# Patient Record
Sex: Male | Born: 1970 | Race: White | Hispanic: No | Marital: Single | Smoking: Current every day smoker
Health system: Southern US, Community
[De-identification: ages and names within clinical notes are randomized; demographics above are authoritative.]

## PROBLEM LIST (undated history)

## (undated) DIAGNOSIS — E119 Type 2 diabetes mellitus without complications: Secondary | ICD-10-CM

## (undated) DIAGNOSIS — K219 Gastro-esophageal reflux disease without esophagitis: Secondary | ICD-10-CM

## (undated) DIAGNOSIS — I10 Essential (primary) hypertension: Secondary | ICD-10-CM

## (undated) DIAGNOSIS — E785 Hyperlipidemia, unspecified: Secondary | ICD-10-CM

## (undated) HISTORY — PX: CHOLECYSTECTOMY: SHX55

## (undated) HISTORY — PX: COSMETIC SURGERY: SHX468

## (undated) HISTORY — DX: Gastro-esophageal reflux disease without esophagitis: K21.9

## (undated) HISTORY — DX: Type 2 diabetes mellitus without complications: E11.9

## (undated) HISTORY — DX: Hyperlipidemia, unspecified: E78.5

## (undated) HISTORY — PX: HAND SURGERY: SHX662

## (undated) HISTORY — DX: Essential (primary) hypertension: I10

---

## 2006-06-08 HISTORY — PX: CHOLECYSTECTOMY: SHX55

## 2006-08-06 ENCOUNTER — Encounter: Admission: RE | Admit: 2006-08-06 | Discharge: 2006-08-06 | Payer: Self-pay | Admitting: Family Medicine

## 2006-08-13 ENCOUNTER — Ambulatory Visit: Payer: Self-pay | Admitting: Gastroenterology

## 2006-08-13 LAB — CONVERTED CEMR LAB
ALT: 59 units/L — ABNORMAL HIGH (ref 0–40)
BUN: 11 mg/dL (ref 6–23)
Basophils Relative: 0 % (ref 0.0–1.0)
CO2: 30 meq/L (ref 19–32)
Calcium: 9.8 mg/dL (ref 8.4–10.5)
Creatinine, Ser: 0.9 mg/dL (ref 0.4–1.5)
Eosinophils Absolute: 0.2 10*3/uL (ref 0.0–0.6)
GFR calc Af Amer: 122 mL/min
Glucose, Bld: 77 mg/dL (ref 70–99)
Lymphocytes Relative: 36.1 % (ref 12.0–46.0)
MCHC: 34.3 g/dL (ref 30.0–36.0)
MCV: 88.3 fL (ref 78.0–100.0)
Monocytes Absolute: 1.2 10*3/uL — ABNORMAL HIGH (ref 0.2–0.7)
Neutro Abs: 5.1 10*3/uL (ref 1.4–7.7)
Platelets: 466 10*3/uL — ABNORMAL HIGH (ref 150–400)
Potassium: 4.2 meq/L (ref 3.5–5.1)
RDW: 11.2 % — ABNORMAL LOW (ref 11.5–14.6)
Sodium: 141 meq/L (ref 135–145)
Total Bilirubin: 0.8 mg/dL (ref 0.3–1.2)

## 2006-08-18 ENCOUNTER — Ambulatory Visit: Payer: Self-pay | Admitting: Cardiology

## 2006-09-14 ENCOUNTER — Inpatient Hospital Stay (HOSPITAL_COMMUNITY): Admission: EM | Admit: 2006-09-14 | Discharge: 2006-09-17 | Payer: Self-pay | Admitting: Emergency Medicine

## 2006-09-16 ENCOUNTER — Ambulatory Visit: Payer: Self-pay | Admitting: Gastroenterology

## 2006-09-27 ENCOUNTER — Ambulatory Visit (HOSPITAL_COMMUNITY): Admission: RE | Admit: 2006-09-27 | Discharge: 2006-09-27 | Payer: Self-pay | Admitting: General Surgery

## 2008-04-24 IMAGING — NM NM LIVER FUNCTION STUDY
2 series · 12 of 12 positions shown · non-contrast
Comparison: none

CLINICAL DATA: Persistent abdominal pain.  Negative prior abdominal ultrasound on 08/06/06, normal standard hepatobiliary scan on 09/14/06.  Further hepatobiliary study is now performed in estimating gallbladder ejection fraction. 
 NUCLEAR MEDICINE HEPATOBILIARY SCAN ? 09/16/06:
TECHNIQUE: Sequential abdominal images were obtained for approximately 60 minutes following intravenous injection of radiopharmaceutical.
 Radiopharmaceutical:  5.1 mCi Dc-11m Choletec IV.  8 ounces of half-and-half were also administered in calculation of ejection fraction.

[hepato · 2.40mm/px · 6 of 60 frames shown (1 of 2)]
[frame 6/60]
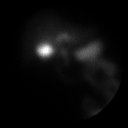
[frame 16/60]
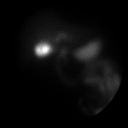
[frame 26/60]
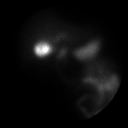
[frame 36/60]
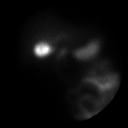
[frame 46/60]
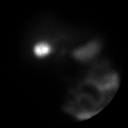
[frame 56/60]
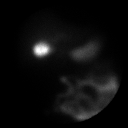

[hepato · 2.40mm/px · 6 of 60 frames shown (2 of 2)]
[frame 6/60]
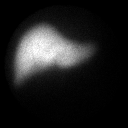
[frame 16/60]
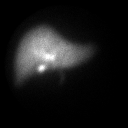
[frame 26/60]
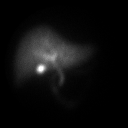
[frame 36/60]
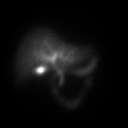
[frame 46/60]
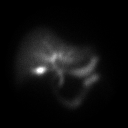
[frame 56/60]
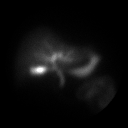

[12 of 12 positions shown; findings below may reference images not displayed]

FINDINGS: Anterior projection imaging again demonstrates normal hepatic excretion of radiopharmaceutical with prompt visualization of the gallbladder and biliary tree by 15 minutes.  Normal patency of the cystic duct and common duct again demonstrated with visualization of small bowel beginning at 25 minutes.
 After administration of half-and-half, there is very little decrease in activity within the gallbladder with estimated ejection fraction of 10% at 60 minutes.  This is abnormal.
IMPRESSION: Evidence of biliary dyskinesia by hepatobiliary scan with abnormally low gallbladder ejection fraction of 10% after administration of half-and-half.

## 2015-09-10 ENCOUNTER — Encounter: Payer: BLUE CROSS/BLUE SHIELD | Attending: Family Medicine | Admitting: Dietician

## 2015-09-10 ENCOUNTER — Encounter: Payer: Self-pay | Admitting: Dietician

## 2015-09-10 DIAGNOSIS — E119 Type 2 diabetes mellitus without complications: Secondary | ICD-10-CM | POA: Diagnosis present

## 2015-09-10 DIAGNOSIS — E7800 Pure hypercholesterolemia, unspecified: Secondary | ICD-10-CM

## 2015-09-10 NOTE — Patient Instructions (Signed)
Plan:  Aim for 3 Carb Choices per meal (45 grams) +/- 1 either way  Aim for 0-1 Carbs per snack if hungry  Include protein in moderation with your meals and snacks Consider reading food labels for Total Carbohydrate and Fat Grams of foods Consider  increasing your activity level by walking for 30 minutes daily as tolerated  Eat slowly!  Stop when you are full.  Eat away from TV and other electronics. Increase your fiber intake slowly.  Men need 35 grams per day. Plant sterols and stanols. Calorie [employer] or other site to become educated about what food you are choosing out to eat. Consider checking thyroid.   Get some source of Vitamin D.  (Sunlight March-October, Almond Milk) Add protein to your smoothie (Plain [place] Yogurt or protein powder)

## 2015-09-10 NOTE — Progress Notes (Signed)
Diabetes Self-Management Education  Visit Type: First/Initial  Appt. Start Time: 0805 Appt. End Time: 0930  09/10/2015  [name], identified by name and date of birth, is a 45 y.o. male with a diagnosis of Diabetes: Type 2.  Other hx includes HTN, hyperlipidemia, lactose intolerance, and GERD.  He states that he is salt sensitive and this causes his blood pressure to increase.  Weight hx:   180-190 lbs in his 20's 210 lbs through mid 63's States that he started on Lipitor for 3 months and gained 30-40 lbs then increased from there over the past 3-4 years.  States that he has prioritized his career over his health.  We discussed other things that may have had a role in weight gain such as increased eating out and portion sizes and increased traveling.  He lives alone.  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    ASSESSMENT  Height 6' (1.829 m), weight 303 lb (137.44 kg). Body mass index is 41.09 kg/(m^2).      Diabetes Self-Management Education - 09/10/15 0815    Visit Information   Visit Type First/Initial   Initial Visit   Diabetes Type Type 2   Are you currently following a meal plan? No   Are you taking your medications as prescribed? Not on Medications   Date Diagnosed 07/2015   Health Coping   How would you rate your overall health? Fair   Psychosocial Assessment   Patient Belief/Attitude about Diabetes Denial   Self-care barriers Other (comment)  increased travel   Self-management support [place] office   Other persons present Patient   Patient Concerns Nutrition/Meal planning;Weight Control   Special Needs None   Preferred Learning Style No preference indicated   Learning Readiness Ready   How often do you need to have someone help you when you read instructions, pamphlets, or other written materials from your doctor or pharmacy? 1 - Never   What is the last grade level you completed in school? 4  years college   Complications   Last HgB A1C per patient/outside source 6.6 %  07/2015   How often do you check your blood sugar? 0 times/day (not testing)   Have you had a dilated eye exam in the past 12 months? No   Have you had a dental exam in the past 12 months? Yes   Are you checking your feet? No   Dietary Intake   Breakfast Smoothie (spinach, broccoli, banana, 1/3 cup blueberries, 1 tsp fiber, 1 cap apple cider vinegar, carrots, flax seeds) when home, hotel:  bagel or eggs and fruit   Snack (morning) none   Lunch [employer] del[employer] (1/2 [place] or chicken salad sandwich, pasta salad or potato salad) OR chick fil-A or [employer] OR Lean Cuisine OR bag of peanuts OR nabs   Snack (afternoon) none   Dinner Chick fil-A:  grilled or salads OR [employer] OR [employer]-  Very large portions but recently has been halfing it.  6-7   Snack (evening) none or SF ice pops   Beverage(s) occasional juice, coffee with 1 sugar   Exercise   Exercise Type Light (walking / raking leaves)   How many days per week to you exercise? 2   How many minutes per day do you exercise? 30   Total minutes per week of exercise 60   Patient Education   Previous Diabetes Education No   Disease state  Definition of diabetes, type 1 and 2, and the diagnosis of diabetes;Explored patient's options for treatment of their diabetes   Nutrition management  Role of diet in the treatment of diabetes and the relationship between the three main macronutrients and blood glucose level;Food label reading, portion sizes and measuring food.;Information on hints to eating out and maintain blood glucose control.;Meal options for control of blood glucose level and chronic complications.   Physical activity and exercise  Role of exercise on diabetes management, blood pressure control and cardiac health.   Monitoring Daily foot exams;Yearly dilated eye exam   Chronic complications Relationship between chronic complications and blood glucose  control   Psychosocial adjustment Worked with patient to identify barriers to care and solutions;Role of stress on diabetes;Travel strategies;Identified and addressed patients feelings and concerns about diabetes   Personal strategies to promote health Lifestyle issues that need to be addressed for better diabetes care;Review risk of smoking and offered smoking cessation   Individualized Goals (developed by patient)   Nutrition General guidelines for healthy choices and portions discussed   Physical Activity Exercise 5-7 days per week;30 minutes per day   Reducing Risk stop smoking   Outcomes   Expected Outcomes Demonstrated interest in learning. Expect positive outcomes   Future DMSE PRN   Program Status Completed      Individualized Plan for Diabetes Self-Management Training:   Learning Objective:  Patient will have a greater understanding of diabetes self-management. Patient education plan is to attend individual and/or group sessions per assessed needs and concerns.   Plan:   Patient Instructions  Plan:  Aim for 3 Carb Choices per meal (45 grams) +/- 1 either way  Aim for 0-1 Carbs per snack if hungry  Include protein in moderation with your meals and snacks Consider reading food labels for Total Carbohydrate and Fat Grams of foods Consider  increasing your activity level by walking for 30 minutes daily as tolerated  Eat slowly!  Stop when you are full.  Eat away from TV and other electronics. Increase your fiber intake slowly.  Men need 35 grams per day. Plant sterols and stanols. Calorie [employer] or other site to become educated about what food you are choosing out to eat. Consider checking thyroid.   Get some source of Vitamin D.  (Sunlight March-October, Almond Milk) Add protein to your smoothie (Plain [place] Yogurt or protein powder)   Expected Outcomes:  Demonstrated interest in learning. Expect positive outcomes  Education material provided: Living Well with  Diabetes, Food label handouts, A1C conversion sheet, Meal plan card, My Plate and Snack sheet, Breakfast ideas  If problems or questions, patient to contact team via:  Phone and Email  Future DSME appointment: PRN

## 2017-08-13 DIAGNOSIS — E119 Type 2 diabetes mellitus without complications: Secondary | ICD-10-CM | POA: Diagnosis not present

## 2017-08-13 DIAGNOSIS — I10 Essential (primary) hypertension: Secondary | ICD-10-CM | POA: Diagnosis not present

## 2017-08-13 DIAGNOSIS — Z0000 Encounter for general adult medical examination without abnormal findings: Secondary | ICD-10-CM | POA: Diagnosis not present

## 2017-08-13 DIAGNOSIS — Z8042 Family history of malignant neoplasm of prostate: Secondary | ICD-10-CM | POA: Diagnosis not present

## 2017-08-13 DIAGNOSIS — K219 Gastro-esophageal reflux disease without esophagitis: Secondary | ICD-10-CM | POA: Diagnosis not present

## 2017-08-13 DIAGNOSIS — E785 Hyperlipidemia, unspecified: Secondary | ICD-10-CM | POA: Diagnosis not present

## 2017-08-13 DIAGNOSIS — R69 Illness, unspecified: Secondary | ICD-10-CM | POA: Diagnosis not present

## 2017-09-13 DIAGNOSIS — R972 Elevated prostate specific antigen [PSA]: Secondary | ICD-10-CM | POA: Diagnosis not present

## 2018-12-14 DIAGNOSIS — R7303 Prediabetes: Secondary | ICD-10-CM | POA: Diagnosis not present

## 2018-12-14 DIAGNOSIS — R69 Illness, unspecified: Secondary | ICD-10-CM | POA: Diagnosis not present

## 2018-12-14 DIAGNOSIS — E785 Hyperlipidemia, unspecified: Secondary | ICD-10-CM | POA: Diagnosis not present

## 2018-12-14 DIAGNOSIS — I10 Essential (primary) hypertension: Secondary | ICD-10-CM | POA: Diagnosis not present

## 2018-12-14 DIAGNOSIS — Z1211 Encounter for screening for malignant neoplasm of colon: Secondary | ICD-10-CM | POA: Diagnosis not present

## 2018-12-14 DIAGNOSIS — K219 Gastro-esophageal reflux disease without esophagitis: Secondary | ICD-10-CM | POA: Diagnosis not present

## 2018-12-14 DIAGNOSIS — E119 Type 2 diabetes mellitus without complications: Secondary | ICD-10-CM | POA: Diagnosis not present

## 2018-12-14 DIAGNOSIS — Z8042 Family history of malignant neoplasm of prostate: Secondary | ICD-10-CM | POA: Diagnosis not present

## 2018-12-20 ENCOUNTER — Encounter: Payer: Self-pay | Admitting: Gastroenterology

## 2018-12-29 DIAGNOSIS — Z8042 Family history of malignant neoplasm of prostate: Secondary | ICD-10-CM | POA: Diagnosis not present

## 2018-12-29 DIAGNOSIS — E119 Type 2 diabetes mellitus without complications: Secondary | ICD-10-CM | POA: Diagnosis not present

## 2018-12-29 DIAGNOSIS — Z1211 Encounter for screening for malignant neoplasm of colon: Secondary | ICD-10-CM | POA: Diagnosis not present

## 2018-12-29 DIAGNOSIS — R69 Illness, unspecified: Secondary | ICD-10-CM | POA: Diagnosis not present

## 2018-12-29 DIAGNOSIS — E785 Hyperlipidemia, unspecified: Secondary | ICD-10-CM | POA: Diagnosis not present

## 2018-12-29 DIAGNOSIS — I10 Essential (primary) hypertension: Secondary | ICD-10-CM | POA: Diagnosis not present

## 2018-12-29 DIAGNOSIS — R7303 Prediabetes: Secondary | ICD-10-CM | POA: Diagnosis not present

## 2018-12-29 DIAGNOSIS — K219 Gastro-esophageal reflux disease without esophagitis: Secondary | ICD-10-CM | POA: Diagnosis not present

## 2019-01-20 ENCOUNTER — Other Ambulatory Visit: Payer: Self-pay

## 2019-01-20 ENCOUNTER — Ambulatory Visit (AMBULATORY_SURGERY_CENTER): Payer: Self-pay | Admitting: *Deleted

## 2019-01-20 VITALS — Temp 97.5°F | Ht 71.0 in | Wt 281.0 lb

## 2019-01-20 DIAGNOSIS — Z1211 Encounter for screening for malignant neoplasm of colon: Secondary | ICD-10-CM

## 2019-01-20 MED ORDER — PEG 3350-KCL-NA BICARB-NACL 420 G PO SOLR: 4000.0000 mL | Freq: Once | ORAL | 0 refills | Status: AC

## 2019-01-20 NOTE — Progress Notes (Signed)
Patient is here for PV today. Patient denies any allergies to eggs or soy. Patient denies any problems with anesthesia/sedation. Patient denies any oxygen use at home. Patient denies taking any diet/weight loss medications or blood thinners. EMMI education assisgned to patient on colonoscopy, this was explained and instructions given to patient.Pt is aware that care partner will wait in the car during procedure; if they feel like they will be too hot to wait in the car; they may wait in the lobby.  We want them to wear a mask (we do not have any that we can provide them), practice social distancing, and we will check their temperatures when they get here.  I did remind patient that their care partner needs to stay in the parking lot the entire time. Pt will wear mask into building.

## 2019-02-03 ENCOUNTER — Encounter: Payer: BLUE CROSS/BLUE SHIELD | Admitting: Gastroenterology

## 2019-02-03 ENCOUNTER — Encounter: Payer: Self-pay | Admitting: Gastroenterology

## 2019-02-03 DIAGNOSIS — R972 Elevated prostate specific antigen [PSA]: Secondary | ICD-10-CM | POA: Diagnosis not present

## 2019-02-09 ENCOUNTER — Telehealth: Payer: Self-pay

## 2019-02-09 NOTE — Telephone Encounter (Signed)
Covid-19 screening questions   Do you now or have you had a fever in the last 14 days? NO   Do you have any respiratory symptoms of shortness of breath or cough now or in the last 14 days? NO  Do you have any family members or close contacts with diagnosed or suspected Covid-19 in the past 14 days? NO  Have you been tested for Covid-19 and found to be positive? NO        

## 2019-02-10 ENCOUNTER — Other Ambulatory Visit: Payer: Self-pay

## 2019-02-10 ENCOUNTER — Encounter: Payer: Self-pay | Admitting: Gastroenterology

## 2019-02-10 ENCOUNTER — Ambulatory Visit (AMBULATORY_SURGERY_CENTER): Payer: 59 | Admitting: Gastroenterology

## 2019-02-10 VITALS — BP 144/84 | HR 69 | Temp 98.4°F | Resp 23 | Ht 71.0 in | Wt 281.0 lb

## 2019-02-10 DIAGNOSIS — K219 Gastro-esophageal reflux disease without esophagitis: Secondary | ICD-10-CM | POA: Diagnosis not present

## 2019-02-10 DIAGNOSIS — Z1211 Encounter for screening for malignant neoplasm of colon: Secondary | ICD-10-CM | POA: Diagnosis not present

## 2019-02-10 DIAGNOSIS — D125 Benign neoplasm of sigmoid colon: Secondary | ICD-10-CM

## 2019-02-10 DIAGNOSIS — K529 Noninfective gastroenteritis and colitis, unspecified: Secondary | ICD-10-CM | POA: Diagnosis not present

## 2019-02-10 DIAGNOSIS — E785 Hyperlipidemia, unspecified: Secondary | ICD-10-CM | POA: Diagnosis not present

## 2019-02-10 DIAGNOSIS — K50018 Crohn's disease of small intestine with other complication: Secondary | ICD-10-CM | POA: Diagnosis not present

## 2019-02-10 DIAGNOSIS — I10 Essential (primary) hypertension: Secondary | ICD-10-CM | POA: Diagnosis not present

## 2019-02-10 MED ORDER — SODIUM CHLORIDE 0.9 % IV SOLN: 500.0000 mL | Freq: Once | INTRAVENOUS | Status: DC

## 2019-02-10 NOTE — Op Note (Signed)
James Valdez Patient Name: James Valdez Procedure Date: 02/10/2019 7:34 AM MRN: [id number] Endoscopist: Milus Banister , MD Age: 48 Referring MD:  Date of Birth: 12/03/1970 Gender: Male Account #: [id number] Procedure:                Colonoscopy Indications:              Screening for colorectal malignant neoplasm Medicines:                Monitored Anesthesia Care Procedure:                Pre-Anesthesia Assessment:                           - Prior to the procedure, a History and Physical                            was performed, and patient medications and                            allergies were reviewed. The patient's tolerance of                            previous anesthesia was also reviewed. The risks                            and benefits of the procedure and the sedation                            options and risks were discussed with the patient.                            All questions were answered, and informed consent                            was obtained. Prior Anticoagulants: The patient has                            taken no previous anticoagulant or antiplatelet                            agents. ASA Grade Assessment: II - A patient with                            mild systemic disease. After reviewing the risks                            and benefits, the patient was deemed in                            satisfactory condition to undergo the procedure.                           After obtaining informed consent, the colonoscope  was passed under direct vision. Throughout the                            procedure, the patient's blood pressure, pulse, and                            oxygen saturations were monitored continuously. The                            Colonoscope was introduced through the anus and                            advanced to the the terminal ileum. The colonoscopy                            was performed  without difficulty. The patient                            tolerated the procedure well. The quality of the                            bowel preparation was good. The terminal ileum,                            ileocecal valve, appendiceal orifice, and rectum                            were photographed. Scope In: 7:52:37 AM Scope Out: 8:07:18 AM Scope Withdrawal Time: 0 hours 12 minutes 54 seconds  Total Procedure Duration: 0 hours 14 minutes 41 seconds  Findings:                 Inflammation, mild in severity and characterized by                            erosions and erythema was found in the terminal                            ileum. Biopsies were taken with a cold forceps for                            histology.                           The mucosa was normal throughout the colon and it                            was sampled in two jars (right and left).                           A 5 mm polyp was found in the sigmoid colon. The                            polyp was semi-pedunculated. The polyp was removed  with a cold snare. Resection and retrieval were                            complete.                           Diverticulosis throughout the colon.                           The exam was otherwise without abnormality on                            direct and retroflexion views. Complications:            No immediate complications. Estimated blood loss:                            None. Estimated Blood Loss:     Estimated blood loss: none. Impression:               - Ileitis, unclear etiology (IBD vs. meds?).                            Biopsied.                           - Normal colon mucosa was sampled (right and left)                           - Diverticulosis.                           - One 5 mm polyp in the sigmoid colon, removed with                            a cold snare. Resected and retrieved.                           - The examination was  otherwise normal on direct                            and retroflexion views. Recommendation:           - Patient has a contact number available for                            emergencies. The signs and symptoms of potential                            delayed complications were discussed with the                            patient. Return to normal activities tomorrow.                            Written discharge instructions were provided to the  patient.                           - Resume previous diet.                           - Continue present medications.                           - Repeat colonoscopy is recommended. The                            colonoscopy date will be determined after pathology                            results from today's exam become available for                            review. Likely 7 years. Milus Banister, MD 02/10/2019 8:14:14 AM This report has been signed electronically.

## 2019-02-10 NOTE — Progress Notes (Signed)
Pt's states no medical or surgical changes since previsit or office visit. 

## 2019-02-10 NOTE — Progress Notes (Signed)
Temperature taken by J.B., VS taken by C.W. 

## 2019-02-10 NOTE — Progress Notes (Signed)
Report to PACU, RN, vss, BBS= Clear.  

## 2019-02-10 NOTE — Progress Notes (Signed)
Called to room to assist during endoscopic procedure.  Patient ID and intended procedure confirmed with present staff. Received instructions for my participation in the procedure from the performing physician.  

## 2019-02-10 NOTE — Patient Instructions (Signed)
Thank you for allowing [place] to care for you today!  Await biopsy results , approximately 2 weeks.  Will make recommendations at that time for future colonoscopy.  Resume previous diet and medications today.  Return to your normal activities tomorrow.      YOU HAD AN ENDOSCOPIC PROCEDURE TODAY AT [facility]:   Refer to the procedure report that was given to you for any specific questions about what was found during the examination.  If the procedure report does not answer your questions, please call your gastroenterologist to clarify.  If you requested that your care partner not be given the details of your procedure findings, then the procedure report has been included in a sealed envelope for you to review at your convenience later.  YOU SHOULD EXPECT: Some feelings of bloating in the abdomen. Passage of more gas than usual.  Walking can help get rid of the air that was put into your GI tract during the procedure and reduce the bloating. If you had a lower endoscopy (such as a colonoscopy or flexible sigmoidoscopy) you may notice spotting of blood in your stool or on the toilet paper. If you underwent a bowel prep for your procedure, you may not have a normal bowel movement for a few days.  Please Note:  You might notice some irritation and congestion in your nose or some drainage.  This is from the oxygen used during your procedure.  There is no need for concern and it should clear up in a day or so.  SYMPTOMS TO REPORT IMMEDIATELY:   Following lower endoscopy (colonoscopy or flexible sigmoidoscopy):  Excessive amounts of blood in the stool  Significant tenderness or worsening of abdominal pains  Swelling of the abdomen that is new, acute  Fever of 100F or higher    For urgent or emergent issues, a gastroenterologist can be reached at any hour by calling [phone].   DIET:  We do recommend a small meal at first, but then you may proceed to your regular diet.   Drink plenty of fluids but you should avoid alcoholic beverages for 24 hours.  ACTIVITY:  You should plan to take it easy for the rest of today and you should NOT DRIVE or use heavy machinery until tomorrow (because of the sedation medicines used during the test).    FOLLOW UP: Our staff will call the number listed on your records 48-72 hours following your procedure to check on you and address any questions or concerns that you may have regarding the information given to you following your procedure. If we do not reach you, we will leave a message.  We will attempt to reach you two times.  During this call, we will ask if you have developed any symptoms of COVID 19. If you develop any symptoms (ie: fever, flu-like symptoms, shortness of breath, cough etc.) before then, please call [phone].  If you test positive for Covid 19 in the 2 weeks post procedure, please call and report this information to [place].    If any biopsies were taken you will be contacted by phone or by letter within the next 1-3 weeks.  Please call us at [phone] if you have not heard about the biopsies in 3 weeks.    SIGNATURES/CONFIDENTIALITY: You and/or your care partner have signed paperwork which will be entered into your electronic medical record.  These signatures attest to the fact that that the information above on your After Visit Summary has  been reviewed and is understood.  Full responsibility of the confidentiality of this discharge information lies with you and/or your care-partner.

## 2019-02-15 ENCOUNTER — Telehealth: Payer: Self-pay | Admitting: *Deleted

## 2019-02-15 NOTE — Telephone Encounter (Signed)
  Follow up Call-  Call back number 02/10/2019  Post procedure Call Back phone  # [phone]  Permission to leave phone message Yes  Some recent data might be hidden     Patient questions:  Do you have a fever, pain , or abdominal swelling? No. Pain Score  0 *  Have you tolerated food without any problems? Yes.    Have you been able to return to your normal activities? Yes.    Do you have any questions about your discharge instructions: Diet   No. Medications  No. Follow up visit  No.  Do you have questions or concerns about your Care? No.  Actions: * If pain score is 4 or above: No action needed, pain <4.  1. Have you developed a fever since your procedure? no  2.   Have you had an respiratory symptoms (SOB or cough) since your procedure? no  3.   Have you tested positive for COVID 19 since your procedure no  4.   Have you had any family members/close contacts diagnosed with the COVID 19 since your procedure?  no   If yes to any of these questions please route to Joylene John, RN and Alphonsa Gin, Therapist, sports.

## 2019-07-22 ENCOUNTER — Ambulatory Visit: Payer: 59 | Attending: Internal Medicine

## 2019-07-22 DIAGNOSIS — Z23 Encounter for immunization: Secondary | ICD-10-CM

## 2019-07-22 NOTE — Progress Notes (Signed)
   Covid-19 Vaccination Clinic  Name:  James Valdez    MRN: [id number] DOB: 09-16-1970  07/22/2019  [name] was observed post Covid-19 immunization for 15 minutes without incidence. He was provided with Vaccine Information Sheet and instruction to access the V-Safe system.   [name] was instructed to call 911 with any severe reactions post vaccine: Marland Kitchen Difficulty breathing  . Swelling of your face and throat  . A fast heartbeat  . A bad rash all over your body  . Dizziness and weakness    Immunizations Administered    Name Date Dose VIS Date Route   Pfizer COVID-19 Vaccine 07/22/2019  8:18 AM 0.3 mL 05/19/2019 Intramuscular   Manufacturer: [place]   Lot: [id number]   [place]: [id number]

## 2019-08-13 ENCOUNTER — Ambulatory Visit: Payer: 59 | Attending: Internal Medicine

## 2019-08-13 DIAGNOSIS — Z23 Encounter for immunization: Secondary | ICD-10-CM | POA: Insufficient documentation

## 2019-08-13 NOTE — Progress Notes (Signed)
   Covid-19 Vaccination Clinic  Name:  James Valdez    MRN: [id number] DOB: Jan 09, 1971  08/13/2019  [name] was observed post Covid-19 immunization for 15 minutes without incident. He was provided with Vaccine Information Sheet and instruction to access the V-Safe system.   [name] was instructed to call 911 with any severe reactions post vaccine: Marland Kitchen Difficulty breathing  . Swelling of face and throat  . A fast heartbeat  . A bad rash all over body  . Dizziness and weakness   Immunizations Administered    Name Date Dose VIS Date Route   Pfizer COVID-19 Vaccine 08/13/2019  9:26 AM 0.3 mL 05/19/2019 Intramuscular   Manufacturer: [place]   Lot: [id number]   [place]: [id number]

## 2021-11-29 ENCOUNTER — Ambulatory Visit (INDEPENDENT_AMBULATORY_CARE_PROVIDER_SITE_OTHER): Payer: No Typology Code available for payment source

## 2021-11-29 ENCOUNTER — Ambulatory Visit
Admission: EM | Admit: 2021-11-29 | Discharge: 2021-11-29 | Disposition: A | Discharge status: Home / Self Care | Payer: No Typology Code available for payment source

## 2021-11-29 DIAGNOSIS — M25572 Pain in left ankle and joints of left foot: Secondary | ICD-10-CM

## 2021-11-29 DIAGNOSIS — M79672 Pain in left foot: Secondary | ICD-10-CM

## 2021-11-29 MED ORDER — IBUPROFEN 600 MG PO TABS: 600.0000 mg | ORAL_TABLET | Freq: Four times a day (QID) | ORAL | 0 refills | Status: DC | PRN

## 2022-02-19 ENCOUNTER — Other Ambulatory Visit: Payer: Self-pay | Admitting: Sports Medicine

## 2022-02-19 DIAGNOSIS — M25572 Pain in left ankle and joints of left foot: Secondary | ICD-10-CM

## 2022-03-01 ENCOUNTER — Ambulatory Visit
Admission: RE | Admit: 2022-03-01 | Discharge: 2022-03-01 | Disposition: A | Discharge status: Home / Self Care | Payer: No Typology Code available for payment source | Source: Ambulatory Visit | Attending: Sports Medicine | Admitting: Sports Medicine

## 2022-03-01 DIAGNOSIS — M25572 Pain in left ankle and joints of left foot: Secondary | ICD-10-CM

## 2023-07-27 ENCOUNTER — Encounter: Payer: Self-pay | Admitting: Physician Assistant

## 2023-09-07 NOTE — Progress Notes (Unsigned)
 09/08/2023 James Valdez [id number] [birth year]  Referring provider: Sigmund Hazel, MD Primary GI doctor: Dr. Doy Hutching (Dr. Christella Hartigan)  ASSESSMENT AND PLAN:   Several episodes of abdominal pain associated with vomiting and diarrhea since Dec 2024,continuing LLQ pain, last episode was Monday, no fever, chills, weight loss, ileitis of unknown significance 2020 on colon without follow up, CT 2008 with wall thickening TI  02/10/2019 colonoscopy for screening ileitis unclear etiology, normal colon mucosa, diverticulosis, 5 mm sigmoid polyp Recall colonoscopy 02/2026 09/15/2006 EGD unremarkable Patient was post to have follow-up visit 2020 after colonoscopy with ileitis showing chronic active inflammation thought secondary to IBD versus medications. Remote CT 2008 showed mild wall thickening terminal ileum thought secondary due to contraction No recent imaging or labs With history some concern for diverticulitis, obstruction versus stricture with TI thickening/ileitis, IBD, gastritis, possible SIBO/gut flora disruption - Order CT abdomen pelvis to evaluate for diverticulitis or bowel obstruction or ileitis  - Check inflammatory markers (sedimentation rate and CRP), very concerned for IBD with past history and images will also get hepatitis panel and TB gold - Provide Ibogard (peppermint oil) samples for bowel spasms. - Prescribe ondansetron for nausea and vomiting. -- Schedule endoscopy and colonoscopy to evaluate for inflammatory bowel disease or other causes for his symptoms at [facility]. Risk of bowel prep, conscious sedation, and EGD and colonoscopy were discussed.  Risks include but are not limited to dehydration, pain, bleeding, cardiopulmonary process, bowel perforation, or other possible adverse outcomes..  Treatment plan was discussed with patient, and agreed upon.  GERD with nausea and vomiting S/p cholecystectomy EGD 2008 negative celiac, unremarkable Lifestyle changes discussed, avoid NSAIDS,  ETOH, hand out given to the patient Weight loss discussed with the patient Smoking cessation discussed in detail Continue prilosec daily  With nausea and vomiting will schedule EGD with colon  Personal history of tubular adenomatous polyps Recall 02/2026  Hepatic steatosis seen on CT abdomen 2008 History of hypertension, type 2 diabetes No recent imaging or labs Will get CBC, HFP, and CT AB and pelvis, hepatitis panel Weight loss advised, stop ETOH Consider further work up pending labs/imaging  Morbid obesity  Body mass index is 40.38 kg/m.  -Patient has been advised to make an attempt to improve diet and exercise patterns to aid in weight loss. -Recommended diet heavy in fruits and veggies and low in animal meats, cheeses, and dairy products, appropriate calorie intake  I have reviewed the clinic note as outlined by Quentin Mulling, PA and agree with the assessment, plan and medical decision making. Mr. Marchetti presents to the office for evaluation of episodic vomiting, diarrhea and bloating progressing over the last 1-1/2 years.  Noteworthy that prior colonoscopy in 2020 disclosed ileitis.  He has a remote CT scan from 2008 with some terminal ileal thickening although underdistention was also considered.  I agree that his disease is in the differential diagnosis.  Appropriate to perform laboratory studies and obtain cross-sectional imaging.  Pending that result further decisions regarding endoscopic evaluation can be considered.  Has a history of colon polyps with a 7-year recall.  Noted to have hepatic steatosis in the past and appropriate to update liver enzymes as above.  [name], MD    Patient Care Team: Sigmund Hazel, MD as PCP - General (Family Medicine)  HISTORY OF PRESENT ILLNESS: 54 y.o. male with a past medical history of hypertension, GERD, diabetes and others listed below presents for evaluation of abdominal pain, vomiting and diarrhea.  Previously known to  Dr.  Christella Hartigan.  Discussed the use of AI scribe software for clinical note transcription with the patient, who gave verbal consent to proceed.  History of Present Illness   James Valdez is a 53 year old male with ileitis and diverticulosis who presents with recurrent episodes of vomiting and diarrhea. He was referred by Dr. Hyacinth Meeker for evaluation of his symptoms.  Since around Christmas of the previous year, he has been experiencing recurrent episodes of vomiting and diarrhea. Initially thought to be the flu, these episodes have persisted intermittently. They are characterized by bloating, sour burps, gas, waves of nausea, vomiting, and explosive watery diarrhea, each lasting a couple of hours, after which he feels perfectly fine. The most recent episode occurred on Monday. These episodes do not occur daily or even weekly.  He has attempted dietary changes, such as eating bland foods and avoiding hard-to-digest items, without relief. He uses Kaopectate and Pepto-Bismol to manage symptoms, which sometimes help alleviate bloating and stomach discomfort. He experiences a persistent discomfort described as a 'sore muscle' in the abdominal area, which is not severe enough to limit activities. Bowel movements between episodes are normal, with no loose stools, blood, or dark stools. No fevers, chills, or weight loss.  His past medical history includes a colonoscopy in 2020 that showed inflammation in the ileum, diverticulosis, and a sigmoid polyp. He also had abdominal surgery as a baby for a condition related to the esophagus and stomach, and his gallbladder has been removed.  He has a family history of colon cancer on his maternal grandfather's side. He smokes and consumes alcohol daily, typically one to two glasses of wine. He has a history of heartburn since his teenage years, for which he occasionally takes omeprazole. He denies any abdominal surgeries other than those mentioned, and he has not experienced any  upper abdominal discomfort, rashes, or joint pain. He was recently prescribed Rybelsus but has not started it yet due to insurance preauthorization.      He  reports that he has been smoking cigarettes. He has never used smokeless tobacco. He reports current alcohol use of about 1.0 standard drink of alcohol per week. He reports that he does not currently use drugs.  RELEVANT GI HISTORY, IMAGING AND LABS: Results   RADIOLOGY CT abdomen pelvis: Thickening of the terminal ileum, fatty liver (2008)  DIAGNOSTIC Colonoscopy: Inflammation in the ileum, ileitis, diverticulosis, 5 mm sigmoid polyp (02/2019)  PATHOLOGY Pathology: Chronic active inflammation in the ileum (02/2019)     02/10/2019 colonoscopy pathology 1. Surgical [P], small bowel, terminal ileum - MILDLY ACTIVE CHRONIC NON-SPECIFIC ILEITIS - NO GRANULOMATA, DYSPLASIA OR MALIGNANCY IDENTIFIED - SEE COMMENT 2. Surgical [P], colon, ascending - BENIGN COLONIC MUCOSA - NO ACTIVE INFLAMMATION OR EVIDENCE OF MICROSCOPIC COLITIS - NO HIGH GRADE DYSPLASIA OR MALIGNANCY IDENTIFIED 3. Surgical [P], colon, descending - BENIGN COLONIC MUCOSA - NO ACTIVE INFLAMMATION OR EVIDENCE OF MICROSCOPIC COLITIS - NO HIGH GRADE DYSPLASIA OR MALIGNANCY IDENTIFIED 4. Surgical [P], colon, sigmoid polyp - TUBULAR ADENOMA (2 OF 3 FRAGMENTS) - BENIGN COLONIC MUCOSA (1 OF 3 FRAGMENTS) - NO HIGH GRADE DYSPLASIA OR MALIGNANCY IDENTIFIED CBC    Component Value Date/Time   WBC 10.2 08/13/2006 1412   RBC 5.27 08/13/2006 1412   HGB 15.9 08/13/2006 1412   HCT 46.5 08/13/2006 1412   PLT 466 (H) 08/13/2006 1412   MCV 88.3 08/13/2006 1412   MCHC 34.3 08/13/2006 1412   RDW 11.2 (L) 08/13/2006 1412   MONOABS 1.2 (H) 08/13/2006  1412   EOSABS 0.2 08/13/2006 1412   BASOSABS 0.0 08/13/2006 1412   No results for input(s): "HGB" in the last 8760 hours.  CMP     Component Value Date/Time   NA 141 08/13/2006 1412   K 4.2 08/13/2006 1412   CL 105 08/13/2006  1412   CO2 30 08/13/2006 1412   GLUCOSE 77 08/13/2006 1412   BUN 11 08/13/2006 1412   CREATININE 0.9 08/13/2006 1412   CALCIUM 9.8 08/13/2006 1412   PROT 7.2 08/13/2006 1412   ALBUMIN 3.9 08/13/2006 1412   AST 36 08/13/2006 1412   ALT 59 (H) 08/13/2006 1412   ALKPHOS 78 08/13/2006 1412   BILITOT 0.8 08/13/2006 1412   GFRNONAA 101 08/13/2006 1412   GFRAA 122 08/13/2006 1412      Latest Ref Rng & Units 08/13/2006    2:12 PM  Hepatic Function  Total Protein 6.0 - 8.3 g/dL 7.2   Albumin 3.5 - 5.2 g/dL 3.9   AST 0 - 37 units/L 36   ALT 0 - 40 units/L 59   Alk Phosphatase 39 - 117 units/L 78   Total Bilirubin 0.3 - 1.2 mg/dL 0.8   Bilirubin, Direct 0.0 - 0.3 mg/dL 0.2       Current Medications:   Current Outpatient Medications (Endocrine & Metabolic):    Semaglutide (RYBELSUS) 3 MG TABS, Take 3 mg by mouth daily.  Current Outpatient Medications (Cardiovascular):    amLODipine (NORVASC) 5 MG tablet, Take 5 mg by mouth daily.   rosuvastatin (CRESTOR) 40 MG tablet, Take 40 mg by mouth daily.     Current Outpatient Medications (Other):    Multiple Vitamin (MULTIVITAMIN WITH MINERALS) TABS tablet, Take 1 tablet by mouth daily.   omeprazole (PRILOSEC OTC) 20 MG tablet, Take 20 mg by mouth as needed.   ondansetron (ZOFRAN) 8 MG tablet, Take 1 tablet (8 mg total) by mouth every 8 (eight) hours as needed for nausea or vomiting.  Medical History:  Past Medical History:  Diagnosis Date   Diabetes mellitus without complication (HCC)    pt denies   GERD (gastroesophageal reflux disease)    Hyperlipidemia    Hypertension    Allergies:  Allergies  Allergen Reactions   Atorvastatin     Other reaction(s): muscle aches     Surgical History:  He  has a past surgical history that includes Hand surgery; Cholecystectomy (2008); and Cosmetic surgery. Family History:  His family history includes Colon cancer in his maternal grandfather; Prostate cancer in his father.  REVIEW OF  SYSTEMS  : All other systems reviewed and negative except where noted in the History of Present Illness.  PHYSICAL EXAM: Pulse 84   Ht 5\' 11"  (1.803 m)   Wt 289 lb 8 oz (131.3 kg)   SpO2 94%   BMI 40.38 kg/m  Physical Exam   GENERAL APPEARANCE: Obese, in no apparent distress. HEENT: No cervical lymphadenopathy, unremarkable thyroid, sclerae anicteric, conjunctiva pink. RESPIRATORY: Respiratory effort normal, breath sounds clear and equal bilaterally without rales, rhonchi, or wheezing. CARDIO: Regular rate and rhythm with no murmurs, rubs, or gallops, peripheral pulses intact. ABDOMEN: Soft, obese, non-distended, active bowel sounds in all four quadrants, non-tender to palpation, no rebound, no mass appreciated. RECTAL: Declines. MUSCULOSKELETAL: Full range of motion, normal gait, without edema. SKIN: Dry, intact without rashes or lesions. No jaundice. NEURO: Alert, oriented, no focal deficits. PSYCH: Cooperative, normal mood and affect.      Doree Albee, PA-C 9:04 AM

## 2023-09-08 ENCOUNTER — Other Ambulatory Visit

## 2023-09-08 ENCOUNTER — Other Ambulatory Visit (INDEPENDENT_AMBULATORY_CARE_PROVIDER_SITE_OTHER)

## 2023-09-08 ENCOUNTER — Encounter: Payer: Self-pay | Admitting: Physician Assistant

## 2023-09-08 ENCOUNTER — Ambulatory Visit (HOSPITAL_COMMUNITY)

## 2023-09-08 ENCOUNTER — Ambulatory Visit: Payer: No Typology Code available for payment source | Admitting: Physician Assistant

## 2023-09-08 VITALS — BP 150/92 | HR 84 | Ht 71.0 in | Wt 289.5 lb

## 2023-09-08 DIAGNOSIS — Z111 Encounter for screening for respiratory tuberculosis: Secondary | ICD-10-CM

## 2023-09-08 DIAGNOSIS — R112 Nausea with vomiting, unspecified: Secondary | ICD-10-CM | POA: Diagnosis not present

## 2023-09-08 DIAGNOSIS — Z1159 Encounter for screening for other viral diseases: Secondary | ICD-10-CM

## 2023-09-08 DIAGNOSIS — R1032 Left lower quadrant pain: Secondary | ICD-10-CM

## 2023-09-08 DIAGNOSIS — K219 Gastro-esophageal reflux disease without esophagitis: Secondary | ICD-10-CM

## 2023-09-08 DIAGNOSIS — R197 Diarrhea, unspecified: Secondary | ICD-10-CM

## 2023-09-08 DIAGNOSIS — Z6841 Body Mass Index (BMI) 40.0 and over, adult: Secondary | ICD-10-CM

## 2023-09-08 DIAGNOSIS — K760 Fatty (change of) liver, not elsewhere classified: Secondary | ICD-10-CM | POA: Diagnosis not present

## 2023-09-08 DIAGNOSIS — E1169 Type 2 diabetes mellitus with other specified complication: Secondary | ICD-10-CM

## 2023-09-08 DIAGNOSIS — Z9049 Acquired absence of other specified parts of digestive tract: Secondary | ICD-10-CM

## 2023-09-08 DIAGNOSIS — Z860101 Personal history of adenomatous and serrated colon polyps: Secondary | ICD-10-CM

## 2023-09-08 LAB — CBC WITH DIFFERENTIAL/PLATELET
Basophils Absolute: 0.2 10*3/uL — ABNORMAL HIGH (ref 0.0–0.1)
Basophils Relative: 1.8 % (ref 0.0–3.0)
Eosinophils Absolute: 0.2 10*3/uL (ref 0.0–0.7)
Eosinophils Relative: 2 % (ref 0.0–5.0)
HCT: 47.5 % (ref 39.0–52.0)
Hemoglobin: 16.2 g/dL (ref 13.0–17.0)
Lymphocytes Relative: 28.6 % (ref 12.0–46.0)
Lymphs Abs: 2.9 10*3/uL (ref 0.7–4.0)
MCHC: 34.1 g/dL (ref 30.0–36.0)
MCV: 92.1 fl (ref 78.0–100.0)
Monocytes Absolute: 0.9 10*3/uL (ref 0.1–1.0)
Monocytes Relative: 8.6 % (ref 3.0–12.0)
Neutro Abs: 5.9 10*3/uL (ref 1.4–7.7)
Neutrophils Relative %: 59 % (ref 43.0–77.0)
Platelets: 351 10*3/uL (ref 150.0–400.0)
RBC: 5.15 Mil/uL (ref 4.22–5.81)
RDW: 13 % (ref 11.5–15.5)
WBC: 10 10*3/uL (ref 4.0–10.5)

## 2023-09-08 LAB — HEPATIC FUNCTION PANEL
ALT: 26 U/L (ref 0–53)
AST: 22 U/L (ref 0–37)
Albumin: 4.6 g/dL (ref 3.5–5.2)
Alkaline Phosphatase: 77 U/L (ref 39–117)
Bilirubin, Direct: 0.1 mg/dL (ref 0.0–0.3)
Total Bilirubin: 0.5 mg/dL (ref 0.2–1.2)
Total Protein: 7.7 g/dL (ref 6.0–8.3)

## 2023-09-08 LAB — SEDIMENTATION RATE: Sed Rate: 19 mm/h (ref 0–20)

## 2023-09-08 LAB — HIGH SENSITIVITY CRP: CRP, High Sensitivity: 4.27 mg/L (ref 0.000–5.000)

## 2023-09-08 LAB — BASIC METABOLIC PANEL WITH GFR
BUN: 10 mg/dL (ref 6–23)
CO2: 27 meq/L (ref 19–32)
Calcium: 9.6 mg/dL (ref 8.4–10.5)
Chloride: 105 meq/L (ref 96–112)
Creatinine, Ser: 0.86 mg/dL (ref 0.40–1.50)
GFR: 97.84 mL/min (ref >60.00)
Glucose, Bld: 101 mg/dL — ABNORMAL HIGH (ref 70–99)
Potassium: 4.2 meq/L (ref 3.5–5.1)
Sodium: 140 meq/L (ref 135–145)

## 2023-09-08 LAB — LIPASE: Lipase: 38 U/L (ref 11.0–59.0)

## 2023-09-08 LAB — TSH: TSH: 1.76 u[IU]/mL (ref 0.35–5.50)

## 2023-09-08 MED ORDER — SUFLAVE 178.7 G PO SOLR: 1.0000 | Freq: Once | ORAL | 0 refills | Status: AC

## 2023-09-08 MED ORDER — ONDANSETRON HCL 8 MG PO TABS: 8.0000 mg | ORAL_TABLET | Freq: Three times a day (TID) | ORAL | 0 refills | Status: AC | PRN

## 2023-09-08 NOTE — Patient Instructions (Signed)
 You have been scheduled for a CT scan of the abdomen and pelvis at [facility], 1st floor Radiology. You are scheduled on 09/08/23  at 5:00 pm. You should arrive 15 minutes prior to your appointment time for registration.   If you have any questions regarding your exam or if you need to reschedule, you may call [facility] at [phone] between the hours of 8:00 am and 5:00 pm, Monday-Friday.    Your provider has requested that you go to the basement level for lab work before leaving today. Press "B" on the elevator. The lab is located at the first door on the left as you exit the elevator.  First do a trial off milk/lactose products if you use them.  Add fiber like benefiber or citracel once a day Increase activity Can do trial of IBGard which is over the counter for AB pain- Take 1-2 capsules once a day for maintence or twice a day during a flare    FODMAP stands for fermentable oligo-, di-, mono-saccharides and polyols (1). These are the scientific terms used to classify groups of carbs that are difficult for our body to digest and that are notorious for triggering digestive symptoms like bloating, gas, loose stools and stomach pain.   You can try low FODMAP diet  - start with eliminating just one column at a time that you feel may be a trigger for you. - the table at the very bottom contains foods that are low in FODMAPs   Sometimes trying to eliminate the FODMAP's from your diet is difficult or tricky, if you are stuggling with trying to do the elimination diet you can try an enzyme.  There is a food enzymes that you sprinkle in or on your food that helps break down the FODMAP. You can read more about the enzyme by going to this site: https://fodzyme.com/   Metabolic dysfunction associated seatohepatitis  Now the leading cause of liver failure in the [place].  It is normally from such risk factors as obesity, diabetes, insulin resistance, high cholesterol,  or metabolic syndrome.  The only definitive therapy is weight loss and exercise.   Suggest walking 20-30 mins daily.  Decreasing carbohydrates, increasing veggies.    Fatty Liver Fatty liver is the accumulation of fat in liver cells. It is also called hepatosteatosis or steatohepatitis. It is normal for your liver to contain some fat. If fat is more than 5 to 10% of your liver's weight, you have fatty liver.  There are often no symptoms (problems) for years while damage is still occurring. People often learn about their fatty liver when they have medical tests for other reasons. Fat can damage your liver for years or even decades without causing problems. When it becomes severe, it can cause fatigue, weight loss, weakness, and confusion. This makes you more likely to develop more serious liver problems. The liver is the largest organ in the body. It does a lot of work and often gives no warning signs when it is sick until late in a disease. The liver has many important jobs including: Breaking down foods. Storing vitamins, iron, and other minerals. Making proteins. Making bile for food digestion. Breaking down many products including medications, alcohol and some poisons.  PROGNOSIS  Fatty liver may cause no damage or it can lead to an inflammation of the liver. This is, called steatohepatitis.  Over time the liver may become scarred and hardened. This condition is called cirrhosis. Cirrhosis is serious and  may lead to liver failure or cancer. NASH is one of the leading causes of cirrhosis. About 10-20% of Americans have fatty liver and a smaller 2-5% has NASH.  TREATMENT  Weight loss, fat restriction, and exercise in overweight patients produces inconsistent results but is worth trying. Good control of diabetes may reduce fatty liver. Eat a balanced, healthy diet. Increase your physical activity. There are no medical or surgical treatments for a fatty liver or NASH, but improving your diet  and increasing your exercise may help prevent or reverse some of the damage.

## 2023-09-09 ENCOUNTER — Ambulatory Visit (HOSPITAL_COMMUNITY)
Admission: RE | Admit: 2023-09-09 | Discharge: 2023-09-09 | Disposition: A | Discharge status: Home / Self Care | Source: Ambulatory Visit | Attending: Physician Assistant | Admitting: Physician Assistant

## 2023-09-09 DIAGNOSIS — R1032 Left lower quadrant pain: Secondary | ICD-10-CM | POA: Insufficient documentation

## 2023-09-09 MED ORDER — IOHEXOL 9 MG/ML PO SOLN: 500.0000 mL | ORAL | Status: AC

## 2023-09-09 MED ORDER — IOHEXOL 350 MG/ML SOLN
75.0000 mL | Freq: Once | INTRAVENOUS | Status: AC | PRN
  Administered 2023-09-09: 75 mL via INTRAVENOUS

## 2023-09-10 LAB — QUANTIFERON-TB GOLD PLUS
Mitogen-NIL: 7.6 [IU]/mL
NIL: 0.03 [IU]/mL
QuantiFERON-TB Gold Plus: NEGATIVE
TB1-NIL: 0.04 [IU]/mL
TB2-NIL: 0.04 [IU]/mL

## 2023-09-10 LAB — HEPATITIS A ANTIBODY, TOTAL: Hepatitis A AB,Total: NONREACTIVE

## 2023-09-10 LAB — HEPATITIS B SURFACE ANTIGEN: Hepatitis B Surface Ag: NONREACTIVE

## 2023-09-10 LAB — HEPATITIS C ANTIBODY: Hepatitis C Ab: NONREACTIVE

## 2023-09-10 LAB — HEPATITIS B SURFACE ANTIBODY,QUALITATIVE: Hep B S Ab: NONREACTIVE

## 2023-10-10 NOTE — Progress Notes (Unsigned)
 [facility] History and Physical   Primary Care Physician:  Perley Bradley, MD   Reason for Procedure:  GERD, nausea, vomiting, left lower quadrant abdominal pain, diarrhea, history of colonic tubular adenomas and ileitis on previous colonoscopy  Plan:    EGD and colonoscopy     HPI: James Valdez is a 53 y.o. male undergoing EGD for evaluation of GERD, nausea, vomiting, diarrhea that has been episodic in nature since December 2024.  Last colonoscopy performed in 2020 showed mild inflammation in the terminal ileum as well as a sigmoid polyp that was a tubular adenoma.  TI pathology showed chronic mildly active nonspecific ileitis-no definite IBD.  Most recent CTAP 09/2023 did not show any acute inflammatory process in the abdomen or pelvis. Chart review indicates colorectal cancer in a maternal grandfather.  No family history of colon polyps.  Past Medical History:  Diagnosis Date   Diabetes mellitus without complication (HCC)    pt denies   GERD (gastroesophageal reflux disease)    Hyperlipidemia    Hypertension     Past Surgical History:  Procedure Laterality Date   CHOLECYSTECTOMY  2008   COSMETIC SURGERY     HAND SURGERY     x2    Prior to Admission medications   Medication Sig Start Date End Date Taking? Authorizing Provider  amLODipine (NORVASC) 5 MG tablet Take 5 mg by mouth daily.    [provider]  Multiple Vitamin (MULTIVITAMIN WITH MINERALS) TABS tablet Take 1 tablet by mouth daily.    [provider]  omeprazole (PRILOSEC OTC) 20 MG tablet Take 20 mg by mouth as needed.    [provider]  ondansetron  (ZOFRAN ) 8 MG tablet Take 1 tablet (8 mg total) by mouth every 8 (eight) hours as needed for nausea or vomiting. 09/08/23   Edmonia Gottron, PA-C  rosuvastatin (CRESTOR) 40 MG tablet Take 40 mg by mouth daily.    [provider]  Semaglutide (RYBELSUS) 3 MG TABS Take 3 mg by mouth daily.    [provider]     Current Outpatient Medications  Medication Sig Dispense Refill   amLODipine (NORVASC) 5 MG tablet Take 5 mg by mouth daily.     Multiple Vitamin (MULTIVITAMIN WITH MINERALS) TABS tablet Take 1 tablet by mouth daily.     omeprazole (PRILOSEC OTC) 20 MG tablet Take 20 mg by mouth as needed.     ondansetron  (ZOFRAN ) 8 MG tablet Take 1 tablet (8 mg total) by mouth every 8 (eight) hours as needed for nausea or vomiting. 30 tablet 0   rosuvastatin (CRESTOR) 40 MG tablet Take 40 mg by mouth daily.     Semaglutide (RYBELSUS) 3 MG TABS Take 3 mg by mouth daily.     No current facility-administered medications for this visit.    Allergies as of 10/11/2023 - Reviewed 09/09/2023  Allergen Reaction Noted   Atorvastatin  10/15/2021    Family History  Problem Relation Age of Onset   Prostate cancer Father    Colon cancer Maternal Grandfather    Colon polyps Neg Hx    Esophageal cancer Neg Hx    Rectal cancer Neg Hx    Stomach cancer Neg Hx    Pancreatic cancer Neg Hx     Social History   Socioeconomic History   Marital status: Single    Spouse name: Not on file   Number of children: Not on file   Years of education: Not on file   Highest  education level: Not on file  Occupational History   Not on file  Tobacco Use   Smoking status: Every Day    Current packs/day: 0.50    Types: Cigarettes   Smokeless tobacco: Never  Vaping Use   Vaping status: Never Used  Substance and Sexual Activity   Alcohol use: Yes    Alcohol/week: 1.0 standard drink of alcohol    Types: 1 Glasses of wine per week    Comment: 1 glass per day   Drug use: Not Currently   Sexual activity: Not on file  Other Topics Concern   Not on file  Social History Narrative   Not on file   Social Drivers of Health   Financial Resource Strain: Not on file  Food Insecurity: Not on file  Transportation Needs: Not on file  Physical Activity: Not on file  Stress: Not on file  Social Connections: Not on file   Intimate Partner Violence: Not on file    Review of Systems:  All other review of systems negative except as mentioned in the HPI.  Physical Exam: Vital signs There were no vitals taken for this visit.  General:   Alert,  Well-developed, well-nourished, pleasant and cooperative in NAD Airway:  Mallampati 2 Lungs:  Clear throughout to auscultation.   Heart:  Regular rate and rhythm; no murmurs, clicks, rubs,  or gallops. Abdomen:  Soft, nontender and nondistended. Normal bowel sounds.   Neuro/Psych:  Normal mood and affect. A and O x 3  Eugenia Hess, MD [facility]

## 2023-10-11 ENCOUNTER — Ambulatory Visit: Admitting: Pediatrics

## 2023-10-11 ENCOUNTER — Encounter: Payer: Self-pay | Admitting: Pediatrics

## 2023-10-11 VITALS — BP 118/73 | HR 63 | Temp 98.9°F | Resp 22 | Ht 71.0 in | Wt 289.0 lb

## 2023-10-11 DIAGNOSIS — R112 Nausea with vomiting, unspecified: Secondary | ICD-10-CM

## 2023-10-11 DIAGNOSIS — Z1211 Encounter for screening for malignant neoplasm of colon: Secondary | ICD-10-CM

## 2023-10-11 DIAGNOSIS — K5730 Diverticulosis of large intestine without perforation or abscess without bleeding: Secondary | ICD-10-CM

## 2023-10-11 DIAGNOSIS — R197 Diarrhea, unspecified: Secondary | ICD-10-CM

## 2023-10-11 DIAGNOSIS — K529 Noninfective gastroenteritis and colitis, unspecified: Secondary | ICD-10-CM

## 2023-10-11 DIAGNOSIS — R109 Unspecified abdominal pain: Secondary | ICD-10-CM

## 2023-10-11 DIAGNOSIS — K219 Gastro-esophageal reflux disease without esophagitis: Secondary | ICD-10-CM

## 2023-10-11 DIAGNOSIS — K648 Other hemorrhoids: Secondary | ICD-10-CM

## 2023-10-11 DIAGNOSIS — K5000 Crohn's disease of small intestine without complications: Secondary | ICD-10-CM | POA: Diagnosis not present

## 2023-10-11 DIAGNOSIS — K2970 Gastritis, unspecified, without bleeding: Secondary | ICD-10-CM

## 2023-10-11 DIAGNOSIS — K621 Rectal polyp: Secondary | ICD-10-CM

## 2023-10-11 DIAGNOSIS — D128 Benign neoplasm of rectum: Secondary | ICD-10-CM

## 2023-10-11 MED ORDER — SODIUM CHLORIDE 0.9 % IV SOLN: 500.0000 mL | Freq: Once | INTRAVENOUS | Status: DC

## 2023-10-11 NOTE — Op Note (Signed)
 [facility] Patient Name: James Valdez Procedure Date: 10/11/2023 3:15 PM MRN: [id number] Endoscopist: Eugenia Hess , MD, [id number] Age: 53 Referring MD:  Date of Birth: 20-Jun-1970 Gender: Male Account #: [id number] Procedure:                Upper GI endoscopy Indications:              Abdominal pain, Follow-up of gastro-esophageal                            reflux disease, Diarrhea, Nausea with vomiting Medicines:                Monitored Anesthesia Care Procedure:                Pre-Anesthesia Assessment:                           - Prior to the procedure, a History and Physical                            was performed, and patient medications and                            allergies were reviewed. The patient's tolerance of                            previous anesthesia was also reviewed. The risks                            and benefits of the procedure and the sedation                            options and risks were discussed with the patient.                            All questions were answered, and informed consent                            was obtained. Prior Anticoagulants: The patient has                            taken no anticoagulant or antiplatelet agents. ASA                            Grade Assessment: III - A patient with severe                            systemic disease. After reviewing the risks and                            benefits, the patient was deemed in satisfactory                            condition to undergo the procedure.  After obtaining informed consent, the endoscope was                            passed under direct vision. Throughout the                            procedure, the patient's blood pressure, pulse, and                            oxygen saturations were monitored continuously. The                            GIF [id number] #[id number] was introduced through the                            mouth, and  advanced to the second part of duodenum.                            The upper GI endoscopy was accomplished without                            difficulty. The patient tolerated the procedure                            well. Scope In: Scope Out: Findings:                 The examined esophagus was normal.                           Scattered mild inflammation characterized by                            adherent blood, erosions and erythema was found in                            the gastric body. Biopsies were taken with a cold                            forceps for Helicobacter pylori testing.                           The gastric antrum, cardia (on retroflexion) and                            gastric fundus (on retroflexion) were normal.                            Biopsies were taken with a cold forceps for                            Helicobacter pylori testing.                           The duodenal bulb and second portion of the  duodenum were normal. Biopsies for histology were                            taken with a cold forceps for evaluation of celiac                            disease. Complications:            No immediate complications. Estimated blood loss:                            Minimal. Estimated Blood Loss:     Estimated blood loss was minimal. Impression:               - Normal esophagus.                           - Gastritis. Biopsied.                           - Normal antrum, cardia and gastric fundus.                            Biopsied.                           - Normal duodenal bulb and second portion of the                            duodenum. Biopsied. Recommendation:           - Await pathology results.                           - Perform a colonoscopy today.                           - The findings and recommendations were discussed                            with the patient's family.                           - Return to GI  clinic in 6 weeks. Eugenia Hess, MD 10/11/2023 3:53:42 PM This report has been signed electronically.

## 2023-10-11 NOTE — Progress Notes (Signed)
 Report given to PACU, vss

## 2023-10-11 NOTE — Progress Notes (Signed)
1519 Robinul 0.1 mg IV given due large amount of secretions upon assessment.  MD made aware, vss

## 2023-10-11 NOTE — Progress Notes (Signed)
 Called to room to assist during endoscopic procedure.  Patient ID and intended procedure confirmed with present staff. Received instructions for my participation in the procedure from the performing physician.

## 2023-10-11 NOTE — Op Note (Signed)
 [facility] Patient Name: James Valdez Procedure Date: 10/11/2023 3:03 PM MRN: [id number] Endoscopist: Eugenia Hess , MD, [id number] Age: 53 Referring MD:  Date of Birth: Jun 26, 1970 Gender: Male Account #: [id number] Procedure:                Colonoscopy Indications:              High risk colon cancer surveillance: Personal                            history of non-advanced adenoma, Last colonoscopy:                            September 2020, Incidental abdominal pain noted,                            Incidental diarrhea noted; Previous finding of                            non-specific ileitis on TI biopsies. Medicines:                Monitored Anesthesia Care Procedure:                Pre-Anesthesia Assessment:                           - Prior to the procedure, a History and Physical                            was performed, and patient medications and                            allergies were reviewed. The patient's tolerance of                            previous anesthesia was also reviewed. The risks                            and benefits of the procedure and the sedation                            options and risks were discussed with the patient.                            All questions were answered, and informed consent                            was obtained. Prior Anticoagulants: The patient has                            taken no anticoagulant or antiplatelet agents. ASA                            Grade Assessment: III - A patient with severe  systemic disease. After reviewing the risks and                            benefits, the patient was deemed in satisfactory                            condition to undergo the procedure.                           After obtaining informed consent, the colonoscope                            was passed under direct vision. Throughout the                            procedure, the patient's blood  pressure, pulse, and                            oxygen saturations were monitored continuously. The                            CF [id number] #[id number] was introduced through the anus                            and advanced to the terminal ileum. The colonoscopy                            was performed without difficulty. The patient                            tolerated the procedure well. The quality of the                            bowel preparation was good. The terminal ileum,                            ileocecal valve, appendiceal orifice, and rectum                            were photographed. Scope In: 3:32:00 PM Scope Out: 3:49:37 PM Scope Withdrawal Time: 0 hours 15 minutes 10 seconds  Total Procedure Duration: 0 hours 17 minutes 37 seconds  Findings:                 The perianal and digital rectal examinations were                            normal. Pertinent negatives include normal                            sphincter tone and no palpable rectal lesions.                           Many small-mouthed diverticula were found in the  sigmoid colon, descending colon, transverse colon                            and ascending colon.                           Normal mucosa was found in the entire colon.                            Biopsies for histology were taken with a cold                            forceps for evaluation of microscopic colitis.                           A 5 mm polyp was found in the rectum. The polyp was                            sessile. The polyp was removed with a cold snare.                            Resection and retrieval were complete.                           Scattered moderate inflammation characterized by                            congestion (edema), erythema and aphthous                            ulcerations was found in the terminal ileum.                            Biopsies were taken with a cold forceps for                             histology.                           Internal hemorrhoids were found during retroflexion. Complications:            No immediate complications. Estimated blood loss:                            Minimal. Estimated Blood Loss:     Estimated blood loss was minimal. Impression:               - Diverticulosis in the sigmoid colon, in the                            descending colon, in the transverse colon and in                            the ascending colon.                           -  Normal mucosa in the entire examined colon.                            Biopsied.                           - One 5 mm polyp in the rectum, removed with a cold                            snare. Resected and retrieved.                           - Moderate inflammation was found in the ileum                            secondary to ileitis. Biopsied.                           - Internal hemorrhoids. Recommendation:           - Discharge patient to home (ambulatory).                           - Await pathology results.                           - Repeat colonoscopy for surveillance based on                            pathology results.                           - The findings and recommendations were discussed                            with the patient's family.                           - Patient has a contact number available for                            emergencies. The signs and symptoms of potential                            delayed complications were discussed with the                            patient. Return to normal activities tomorrow.                            Written discharge instructions were provided to the                            patient. Eugenia Hess, MD 10/11/2023 4:03:08 PM This report has been signed electronically.

## 2023-10-11 NOTE — Patient Instructions (Signed)
Handouts given on polyps, hemorrhoids and diverticulosis.  YOU HAD AN ENDOSCOPIC PROCEDURE TODAY AT THE [facility]:   Refer to the procedure report that was given to you for any specific questions about what was found during the examination.  If the procedure report does not answer your questions, please call your gastroenterologist to clarify.  If you requested that your care partner not be given the details of your procedure findings, then the procedure report has been included in a sealed envelope for you to review at your convenience later.  YOU SHOULD EXPECT: Some feelings of bloating in the abdomen. Passage of more gas than usual.  Walking can help get rid of the air that was put into your GI tract during the procedure and reduce the bloating. If you had a lower endoscopy (such as a colonoscopy or flexible sigmoidoscopy) you may notice spotting of blood in your stool or on the toilet paper. If you underwent a bowel prep for your procedure, you may not have a normal bowel movement for a few days.  Please Note:  You might notice some irritation and congestion in your nose or some drainage.  This is from the oxygen used during your procedure.  There is no need for concern and it should clear up in a day or so.  SYMPTOMS TO REPORT IMMEDIATELY:  Following lower endoscopy (colonoscopy or flexible sigmoidoscopy):  Excessive amounts of blood in the stool  Significant tenderness or worsening of abdominal pains  Swelling of the abdomen that is new, acute  Fever of 100F or higher  Following upper endoscopy (EGD)  Vomiting of blood or coffee ground material  New chest pain or pain under the shoulder blades  Painful or persistently difficult swallowing  New shortness of breath  Fever of 100F or higher  Black, tarry-looking stools  For urgent or emergent issues, a gastroenterologist can be reached at any hour by calling [phone]. Do not use MyChart messaging for urgent  concerns.    DIET:  We do recommend a small meal at first, but then you may proceed to your regular diet.  Drink plenty of fluids but you should avoid alcoholic beverages for 24 hours.  ACTIVITY:  You should plan to take it easy for the rest of today and you should NOT DRIVE or use heavy machinery until tomorrow (because of the sedation medicines used during the test).    FOLLOW UP: Our staff will call the number listed on your records the next business day following your procedure.  We will call around 7:15- 8:00 am to check on you and address any questions or concerns that you may have regarding the information given to you following your procedure. If we do not reach you, we will leave a message.     If any biopsies were taken you will be contacted by phone or by letter within the next 1-3 weeks.  Please call us at [phone] if you have not heard about the biopsies in 3 weeks.    SIGNATURES/CONFIDENTIALITY: You and/or your care partner have signed paperwork which will be entered into your electronic medical record.  These signatures attest to the fact that that the information above on your After Visit Summary has been reviewed and is understood.  Full responsibility of the confidentiality of this discharge information lies with you and/or your care-partner.

## 2023-10-11 NOTE — Progress Notes (Signed)
 Vitals-DT  Pt's states no medical or surgical changes since previsit or office visit.

## 2023-10-12 ENCOUNTER — Telehealth: Payer: Self-pay

## 2023-10-12 NOTE — Telephone Encounter (Signed)
  Follow up Call-     10/11/2023    2:46 PM 10/11/2023    2:40 PM  Call back number  Post procedure Call Back phone  # [phone]   Permission to leave phone message  Yes     Patient questions:  Do you have a fever, pain , or abdominal swelling? No. Pain Score  0 *  Have you tolerated food without any problems? Yes.    Have you been able to return to your normal activities? Yes.    Do you have any questions about your discharge instructions: Diet   No. Medications  No. Follow up visit  No.  Do you have questions or concerns about your Care? No.  Actions: * If pain score is 4 or above: No action needed, pain <4.

## 2023-10-14 LAB — SURGICAL PATHOLOGY

## 2023-10-19 ENCOUNTER — Ambulatory Visit: Payer: Self-pay | Admitting: Pediatrics

## 2023-12-15 NOTE — Progress Notes (Deleted)
 [facility] Return Visit   Referring Provider Cleotilde Planas, MD [address] [place],  [place] [address]  Primary Care Provider Cleotilde Planas, MD  Patient Profile: James Valdez is a 53 y.o. male who returns to the [facility] for follow-up of the problem(s) noted below.  Problem List: GERD Nausea, Vomiting Diarrhea Non-specific ileitis on TI biopsies History of adenomatous colon polyp Colonic diverticulosis History of cholecystectomy Hepatic steatosis on abdominal imaging   History of Present Illness   James Valdez was last seen in the GI office 09/08/2023 by Alan Coombs, PA   Current GI Meds  Omeprazole 20 mg daily as needed  Interval History    Last colonoscopy: 10/2023 - normal colonic mucosa, 5 mm rectal polyp, scattered moderate inflammation in TI, diverticulosis, IH Last endoscopy: 10/2023 - scattered mild inflammation in gastric body, otherwise normal  Last Abd CT/CTE/MRE:  42025 -hepatic steatosis, small fat-containing periumbilical hernia and bilateral inguinal hernias, no acute inflammatory process in the abdomen  GI Review of Symptoms Significant for {GIROS:50592}. Otherwise negative.  General Review of Systems  Review of systems is significant for the pertinent positives and negatives as listed per the HPI.  Full ROS is otherwise negative.  Past Medical History   Past Medical History:  Diagnosis Date   Diabetes mellitus without complication (HCC)    pt denies   GERD (gastroesophageal reflux disease)    Hyperlipidemia    Hypertension      Past Surgical History   Past Surgical History:  Procedure Laterality Date   CHOLECYSTECTOMY  2008   COSMETIC SURGERY     HAND SURGERY     x2     Allergies and Medications   Allergies  Allergen Reactions   Atorvastatin Other (See Comments)    Other reaction(s): muscle aches   @MEDSTODAY @  Family His   Family History  Problem Relation Age of Onset    Prostate cancer Father    Colon cancer Maternal Grandfather    Colon polyps Neg Hx    Esophageal cancer Neg Hx    Rectal cancer Neg Hx    Stomach cancer Neg Hx    Pancreatic cancer Neg Hx    GI Specific Family History: {gifamhx:50061}   Social History   Social History   Tobacco Use   Smoking status: Every Day    Current packs/day: 0.50    Types: Cigarettes   Smokeless tobacco: Never  Vaping Use   Vaping status: Never Used  Substance Use Topics   Alcohol use: Yes    Alcohol/week: 1.0 standard drink of alcohol    Types: 1 Glasses of wine per week    Comment: 1 glass per day   Drug use: Not Currently   James Valdez reports that he has been smoking cigarettes. He has never used smokeless tobacco. He reports current alcohol use of about 1.0 standard drink of alcohol per week. He reports that he does not currently use drugs.  Vital Signs and Physical Examination   There were no vitals filed for this visit. There is no height or weight on file to calculate BMI.    General: Well developed, well nourished, no acute distress Head: Normocephalic and atraumatic Eyes: Sclerae anicteric, EOMI Ears: Normal auditory acuity Mouth: No deformities or lesions noted Lungs: Clear throughout to auscultation Heart: Regular rate and rhythm; No murmurs, rubs or bruits Abdomen: Soft, non tender and non distended. No masses, hepatosplenomegaly or hernias noted. Normal Bowel sounds Rectal: Musculoskeletal: Symmetrical with no gross  deformities  Pulses:  Normal pulses noted Extremities: No edema or deformities noted Neurological: Alert oriented x 4, grossly nonfocal Psychological:  Alert and cooperative. Normal mood and affect   Review of Data   The following data was reviewed at the time of this encounter:   Laboratory Studies      Latest Ref Rng & Units 09/08/2023    9:32 AM 08/13/2006    2:12 PM  CBC  WBC 4.0 - 10.5 K/uL 10.0  10.2   Hemoglobin 13.0 - 17.0 g/dL 83.7  84.0   Hematocrit  39.0 - 52.0 % 47.5  46.5   Platelets 150.0 - 400.0 K/uL 351.0  466     Lab Results  Component Value Date   LIPASE 38.0 09/08/2023      Latest Ref Rng & Units 09/08/2023    9:32 AM 08/13/2006    2:12 PM  CMP  Glucose 70 - 99 mg/dL 898  77   BUN 6 - 23 mg/dL 10  11   Creatinine 9.59 - 1.50 mg/dL 9.13  0.9   Sodium 864 - 145 mEq/L 140  141   Potassium 3.5 - 5.1 mEq/L 4.2  4.2   Chloride 96 - 112 mEq/L 105  105   CO2 19 - 32 mEq/L 27  30   Calcium 8.4 - 10.5 mg/dL 9.6  9.8   Total Protein 6.0 - 8.3 g/dL 7.7  7.2   Total Bilirubin 0.2 - 1.2 mg/dL 0.5  0.8   Alkaline Phos 39 - 117 U/L 77  78   AST 0 - 37 U/L 22  36   ALT 0 - 53 U/L 26  59      Imaging Studies  CTAP 09/09/2023 hepatic steatosis, small fat-containing periumbilical hernia and bilateral inguinal hernias, no acute inflammatory process in the abdomen  GI Procedures and Studies  EGD/Colonoscopy 10/11/2023 EGD -scattered mild inflammation in gastric body, otherwise normal Colonoscopy -normal colonic mucosa, 5 mm rectal polyp, scattered moderate inflammation in TI, diverticulosis, IH Path: Normal colonic mucosa, TI biopsies with isolated fragment with focally active chronic nonspecific ileitis, majority of fragments are normal, normal gastric and duodenal biopsies  Colonoscopy 02/10/2019 Mild inflammation in TI, 5 mm polyp in sigmoid colon, diverticulosis Path: Nonspecific ileitis, normal colonic mucosa, tubular adenoma  EGD/Colonoscopy 09/15/2006 EGD - normal Colonoscopy - normal colon and TI Path: Normal colon and duodenal biopsies   Clinical Impression  It is my clinical impression that James Valdez is a 53 y.o. male with;  ***  Plan  *** *** *** *** ***   Planned Follow Up No follow-ups on file.  The patient or caregiver verbalized understanding of the material covered, with no barriers to understanding. All questions were answered. Patient or caregiver is agreeable with the plan outlined above.    It was a  pleasure to see James Valdez.  If you have any questions or concerns regarding this evaluation, do not hesitate to contact me.  Inocente Hausen, MD [facility]

## 2023-12-17 ENCOUNTER — Ambulatory Visit: Admitting: Pediatrics
